# Patient Record
Sex: Male | Born: 1985 | Race: White | Hispanic: No | Marital: Single | Smoking: Current every day smoker
Health system: Southern US, Community
[De-identification: ages and names within clinical notes are randomized; demographics above are authoritative.]

## PROBLEM LIST (undated history)

## (undated) DIAGNOSIS — J40 Bronchitis, not specified as acute or chronic: Secondary | ICD-10-CM

## (undated) DIAGNOSIS — J45909 Unspecified asthma, uncomplicated: Secondary | ICD-10-CM

## (undated) HISTORY — PX: WRIST ARTHROSCOPY: SUR100

## (undated) HISTORY — PX: ANKLE ARTHROSCOPY: SHX545

---

## 2014-11-04 ENCOUNTER — Emergency Department (HOSPITAL_COMMUNITY): Payer: Self-pay

## 2014-11-04 ENCOUNTER — Encounter (HOSPITAL_COMMUNITY): Payer: Self-pay | Admitting: Emergency Medicine

## 2014-11-04 ENCOUNTER — Emergency Department (HOSPITAL_COMMUNITY)
Admission: EM | Admit: 2014-11-04 | Discharge: 2014-11-04 | Disposition: A | Discharge status: Home / Self Care | Payer: Self-pay | Attending: Emergency Medicine | Admitting: Emergency Medicine

## 2014-11-04 DIAGNOSIS — R110 Nausea: Secondary | ICD-10-CM | POA: Insufficient documentation

## 2014-11-04 DIAGNOSIS — S4992XA Unspecified injury of left shoulder and upper arm, initial encounter: Secondary | ICD-10-CM

## 2014-11-04 DIAGNOSIS — W132XXA Fall from, out of or through roof, initial encounter: Secondary | ICD-10-CM | POA: Insufficient documentation

## 2014-11-04 DIAGNOSIS — Y9289 Other specified places as the place of occurrence of the external cause: Secondary | ICD-10-CM | POA: Insufficient documentation

## 2014-11-04 DIAGNOSIS — Y998 Other external cause status: Secondary | ICD-10-CM | POA: Insufficient documentation

## 2014-11-04 DIAGNOSIS — Z720 Tobacco use: Secondary | ICD-10-CM | POA: Insufficient documentation

## 2014-11-04 DIAGNOSIS — Y9389 Activity, other specified: Secondary | ICD-10-CM | POA: Insufficient documentation

## 2014-11-04 MED ORDER — HYDROMORPHONE HCL 1 MG/ML IJ SOLN
1.0000 mg | Freq: Once | INTRAMUSCULAR | Status: AC
  Administered 2014-11-04: 1 mg via INTRAVENOUS
  Filled 2014-11-04: qty 1

## 2014-11-04 MED ORDER — SODIUM CHLORIDE 0.9 % IV BOLUS (SEPSIS)
1000.0000 mL | Freq: Once | INTRAVENOUS | Status: AC
  Administered 2014-11-04: 1000 mL via INTRAVENOUS

## 2014-11-04 MED ORDER — HYDROCODONE-ACETAMINOPHEN 5-325 MG PO TABS: 1.0000 | ORAL_TABLET | Freq: Four times a day (QID) | ORAL | Status: DC | PRN

## 2014-11-04 MED ORDER — IBUPROFEN 800 MG PO TABS: 800.0000 mg | ORAL_TABLET | Freq: Three times a day (TID) | ORAL | Status: AC

## 2014-11-04 NOTE — ED Provider Notes (Signed)
CSN: [id number]     Arrival date & time 11/04/14  1833 History   First MD Initiated Contact with Patient 11/04/14 1853     Chief Complaint  Patient presents with  . Shoulder Pain     (Consider location/radiation/quality/duration/timing/severity/associated sxs/prior Treatment) HPI Patient presents immediately after sustaining an injury to his left shoulder. Patient fell approximately 10/12 feet from a low roof onto his left shoulder. No head trauma, no loss of consciousness, no neck pain or head pain. No chest pain, belly pain. Patient has been ambulatory. Patient has substantial, severe pain in the left shoulder, worse with any range of motion attempt. No distal loss of sensation or weakness. No history of ankle surgery, pain. Patient has not taken any medication for pain relief thus far.  History reviewed. No pertinent past medical history. Past Surgical History  Procedure Laterality Date  . Wrist arthroscopy    . Ankle arthroscopy     History reviewed. No pertinent family history. History  Substance Use Topics  . Smoking status: Current Every Day Smoker -- 0.50 packs/day    Types: Cigarettes  . Smokeless tobacco: Not on file  . Alcohol Use: No    Review of Systems  Constitutional: Negative for fever.  HENT: Negative.   Respiratory: Negative for shortness of breath.   Cardiovascular: Negative for chest pain.  Gastrointestinal: Positive for nausea. Negative for vomiting.  Musculoskeletal:       Negative aside from HPI  Skin: Negative for wound.       Negative aside from HPI  Allergic/Immunologic: Negative for food allergies and immunocompromised state.  Neurological: Negative for weakness.      Allergies  Review of patient's allergies indicates no known allergies.  Home Medications   Prior to Admission medications   Not on File   BP 128/71 mmHg  Pulse 87  Temp(Src) 97.9 F (36.6 C) (Oral)  Resp 24  Ht  (1.753 m)  Wt 165 lb (74.844 kg)  BMI 24.36  kg/m2  SpO2 98% Physical Exam  Constitutional: He is oriented to person, place, and time. He appears well-developed. No distress.  Very uncomfortable appearing young male, holding his left arm in complete abduction  HENT:  Head: Normocephalic and atraumatic.  Eyes: Conjunctivae and EOM are normal.  Neck: Full passive range of motion without pain. No spinous process tenderness and no muscular tenderness present. No rigidity. No tracheal deviation, no edema, no erythema and normal range of motion present.  Cardiovascular: Normal rate and regular rhythm.   Pulmonary/Chest: Effort normal. No stridor. No respiratory distress.  Abdominal: He exhibits no distension.  Musculoskeletal: He exhibits no edema.       Right shoulder: Normal.       Left wrist: Normal.       Arms: Neurological: He is alert and oriented to person, place, and time.  Skin: Skin is warm and dry.  Psychiatric: He has a normal mood and affect.  Nursing note and vitals reviewed.  Patient ambulatory to the room ED Course  Procedures (including critical care time)   Imaging Review Dg Shoulder Left  11/04/2014   CLINICAL DATA:  29 year old male with left shoulder pain after he slipped and fell off a roof.  EXAM: LEFT SHOULDER - 2+ VIEW  COMPARISON:  None.  FINDINGS: Superior subluxation of the distal clavicle with respect to the acromion. However, there is healed posttraumatic deformity of the distal clavicular head. This injury is age indeterminate. No evidence of fracture of the humerus or  scapula. The humeral head is located. The visualized thorax is unremarkable.  IMPRESSION: Age indeterminate grade 3 acromioclavicular joint separation. There is some healed irregularity about the distal aspect of the clavicle raising the possibility that this may represent a remote, or possibly acute on remote injury.   Electronically Signed   By: Malachy MoanHeath  McCullough M.D.   On: 11/04/2014 19:43   On repeat exam the patient appears calm. I  demonstrated the x-ray to him, I agree with the interpretation.  Ice applied, sling applied, both well tolerated. MDM  Patient presents with acute onset left shoulder pain following a fall. Patient's x-ray demonstrates disruption of the acromioclavicular joint, no fracture. Patient had pain well-controlled with narcotics, ice. Patient was placed in sling, discharged in stable condition.  Gerhard Munchobert Rilley Poulter, MD 11/04/14 2033

## 2014-11-04 NOTE — Discharge Instructions (Signed)
As discussed, it is very important that you speak with our orthopedists tomorrow to arrange appropriate ongoing evaluation of your acromioclavicular separation.  Please use ice packs, 4 times daily, and addition to the prescribed medication for pain relief.  Return to the emergency department for concerning changes in your condition.  Acromioclavicular Injuries The AC (acromioclavicular) joint is the joint in the shoulder where the collarbone (clavicle) meets the shoulder blade (scapula). The part of the shoulder blade connected to the collarbone is called the acromion. Common problems with and treatments for the [facility] joint are detailed below. ARTHRITIS Arthritis occurs when the joint has been injured and the smooth padding between the joints (cartilage) is lost. This is the wear and tear seen in most joints of the body if they have been overused. This causes the joint to produce pain and swelling which is worse with activity.  AC JOINT SEPARATION AC joint separation means that the ligaments connecting the acromion of the shoulder blade and collarbone have been damaged, and the two bones no longer line up. AC separations can be anywhere from mild to severe, and are "graded" depending upon which ligaments are torn and how badly they are torn.  Grade I Injury: the least damage is done, and the [facility] joint still lines up.  Grade II Injury: damage to the ligaments which reinforce the [facility] joint. In a Grade II injury, these ligaments are stretched but not entirely torn. When stressed, the [facility] joint becomes painful and unstable.  Grade III Injury: AC and secondary ligaments are completely torn, and the collarbone is no longer attached to the shoulder blade. This results in deformity; a prominence of the end of the clavicle. AC JOINT FRACTURE AC joint fracture means that there has been a break in the bones of the [facility] joint, usually the end of the clavicle. TREATMENT TREATMENT OF AC ARTHRITIS  There is  currently no way to replace the cartilage damaged by arthritis. The best way to improve the condition is to decrease the activities which aggravate the problem. Application of ice to the joint helps decrease pain and soreness (inflammation). The use of non-steroidal anti-inflammatory medication is helpful.  If less conservative measures do not work, then cortisone shots (injections) may be used. These are anti-inflammatories; they decrease the soreness in the joint and swelling.  If non-surgical measures fail, surgery may be recommended. The procedure is generally removal of a portion of the end of the clavicle. This is the part of the collarbone closest to your acromion which is stabilized with ligaments to the acromion of the shoulder blade. This surgery may be performed using a tube-like instrument with a light (arthroscope) for looking into a joint. It may also be performed as an open surgery through a small incision by the surgeon. Most patients will have good range of motion within 6 weeks and may return to all activity including sports by 8-12 weeks, barring complications. TREATMENT OF AN AC SEPARATION  The initial treatment is to decrease pain. This is best accomplished by immobilizing the arm in a sling and placing an ice pack to the shoulder for 20 to 30 minutes every 2 hours as needed. As the pain starts to subside, it is important to begin moving the fingers, wrist, elbow and eventually the shoulder in order to prevent a stiff or "frozen" shoulder. Instruction on when and how much to move the shoulder will be provided by your caregiver. The length of time needed to regain full motion and function  depends on the amount or grade of the injury. Recovery from a Grade I AC separation usually takes 10 to 14 days, whereas a Grade III may take 6 to 8 weeks.  Grade I and II separations usually do not require surgery. Even Grade III injuries usually allow return to full activity with few restrictions.  Treatment is also based on the activity demands of the injured shoulder. For example, a high level quarterback with an injured throwing arm will receive more aggressive treatment than someone with a desk job who rarely uses his/her arm for strenuous activities. In some cases, a painful lump may persist which could require a later surgery. Surgery can be very successful, but the benefits must be weighed against the potential risks. TREATMENT OF AN AC JOINT FRACTURE Fracture treatment depends on the type of fracture. Sometimes a splint or sling may be all that is required. Other times surgery may be required for repair. This is more frequently the case when the ligaments supporting the clavicle are completely torn. Your caregiver will help you with these decisions and together you can decide what will be the best treatment. HOME CARE INSTRUCTIONS   Apply ice to the injury for 15-20 minutes each hour while awake for 2 days. Put the ice in a plastic bag and place a towel between the bag of ice and skin.  If a sling has been applied, wear it constantly for as long as directed by your caregiver, even at night. The sling or splint can be removed for bathing or showering or as directed. Be sure to keep the shoulder in the same place as when the sling is on. Do not lift the arm.  If a figure-of-eight splint has been applied it should be tightened gently by another person every day. Tighten it enough to keep the shoulders held back. Allow enough room to place the index finger between the body and strap. Loosen the splint immediately if there is numbness or tingling in the hands.  Take over-the-counter or prescription medicines for pain, discomfort or fever as directed by your caregiver.  If you or your child has received a follow up appointment, it is very important to keep that appointment in order to avoid long term complications, chronic pain or disability. SEEK MEDICAL CARE IF:   The pain is not relieved  with medications.  There is increased swelling or discoloration that continues to get worse rather than better.  You or your child has been unable to follow up as instructed.  There is progressive numbness and tingling in the arm, forearm or hand. SEEK IMMEDIATE MEDICAL CARE IF:   The arm is numb, cold or pale.  There is increasing pain in the hand, forearm or fingers. MAKE SURE YOU:   Understand these instructions.  Will watch your condition.  Will get help right away if you are not doing well or get worse. Document Released: 01/18/2005 Document Revised: 07/03/2011 Document Reviewed: 07/13/2008 [facility] Patient Information 2015 [place], [place]. This information is not intended to replace advice given to you by your health care provider. Make sure you discuss any questions you have with your health care provider.

## 2014-11-04 NOTE — ED Notes (Signed)
Dr.Lockwood at bedside  

## 2014-11-04 NOTE — ED Notes (Signed)
PT stated he slipped and fell off a roof x2 hours onto his left shoulder. Obvious deformity noted to left shoulder with decreased and painful ROM.

## 2014-11-13 ENCOUNTER — Encounter (HOSPITAL_COMMUNITY): Payer: Self-pay | Admitting: Emergency Medicine

## 2014-11-13 ENCOUNTER — Emergency Department (HOSPITAL_COMMUNITY)
Admission: EM | Admit: 2014-11-13 | Discharge: 2014-11-13 | Disposition: A | Discharge status: Home / Self Care | Payer: Self-pay | Attending: Emergency Medicine | Admitting: Emergency Medicine

## 2014-11-13 ENCOUNTER — Emergency Department (HOSPITAL_COMMUNITY): Payer: Self-pay

## 2014-11-13 DIAGNOSIS — S43102D Unspecified dislocation of left acromioclavicular joint, subsequent encounter: Secondary | ICD-10-CM

## 2014-11-13 DIAGNOSIS — Z720 Tobacco use: Secondary | ICD-10-CM | POA: Insufficient documentation

## 2014-11-13 DIAGNOSIS — W1789XD Other fall from one level to another, subsequent encounter: Secondary | ICD-10-CM | POA: Insufficient documentation

## 2014-11-13 MED ORDER — HYDROCODONE-ACETAMINOPHEN 5-325 MG PO TABS: 1.0000 | ORAL_TABLET | Freq: Four times a day (QID) | ORAL | Status: DC | PRN

## 2014-11-13 MED ORDER — IBUPROFEN 800 MG PO TABS: 800.0000 mg | ORAL_TABLET | Freq: Three times a day (TID) | ORAL | Status: DC | PRN

## 2014-11-13 MED ORDER — OXYCODONE-ACETAMINOPHEN 5-325 MG PO TABS
1.0000 | ORAL_TABLET | Freq: Once | ORAL | Status: AC
  Administered 2014-11-13: 1 via ORAL
  Filled 2014-11-13: qty 1

## 2014-11-13 NOTE — ED Notes (Signed)
PA Tran at bedside. 

## 2014-11-13 NOTE — ED Notes (Signed)
Ortho called 

## 2014-11-13 NOTE — Discharge Instructions (Signed)
Acromioclavicular Separation with Rehab The acromioclavicular joint is the joint between the roof of the shoulder (acromion) and the collarbone (clavicle). It is vulnerable to injury. An acromioclavicular (AC) separation is a partial or complete tear (sprain), injury, or redness and soreness (inflammation) of the ligaments that cross the acromioclavicular joint and hold it in place. There are two ligaments in this area that are vulnerable to injury, the acromioclavicular ligament and the coracoclavicular ligament. SYMPTOMS   Tenderness and swelling, or a bump on top of the shoulder (at the AC joint).  Bruising (contusion) in the area within 48 hours of injury.  Loss of strength or pain when reaching over the head or across the body. CAUSES  AC separation is caused by direct trauma to the joint (falling on your shoulder) or indirect trauma (falling on an outstretched arm). RISK INCREASES WITH:  Sports that require contact or collision, throwing sports (i.e. racquetball, squash).  Poor strength and flexibility.  Previous shoulder sprain or dislocation.  Poorly fitted or padded protective equipment. PREVENTION   Warm-up and stretch properly before activity.  Maintain physical fitness:  Shoulder strength.  Shoulder flexibility.  Cardiovascular fitness.  Wear properly fitted and padded protective equipment.  Learn and use proper technique when playing sports. Have a coach correct improper technique, including falling and landing.  Apply taping, protective strapping or padding, or an adhesive bandage as recommended before practice or competition. PROGNOSIS   If treated properly, the symptoms of AC separation can be expected to go away.  If treated improperly, permanent disability may occur unless surgery is performed.  Healing time varies with type of sport and position, arm injured (dominant versus non-dominant) and severity of sprain. RELATED COMPLICATIONS  Weakness and  fatigue of the arm or shoulder are possible but uncommon.  Pain and inflammation of the AC joint may continue.  Prolonged healing time may be necessary if usual activities are resumed too early. This causes a susceptibility to recurrent injury.  Prolonged disability may occur.  The shoulder may remain unstable or arthritic following repeated injury. TREATMENT  Treatment initially involves ice and medication to help reduce pain and inflammation. It may also be necessary to modify your activities in order to prevent further injury. Both non-surgical and surgical interventions exist to treat AC separation. Non-surgical intervention is usually recommended and involves wearing a sling to immobilize the joint for a period of time to allow for healing. Surgical intervention is usually only considered for severe sprains of the ligament or for individuals who do not improve after 2 to 6 months of non-surgical treatment. Surgical interventions require 4 to 6 months before a return to sports is possible. MEDICATION  If pain medication is necessary, nonsteroidal anti-inflammatory medications, such as aspirin and ibuprofen, or other minor pain relievers, such as acetaminophen, are often recommended.  Do not take pain medication for 7 days before surgery.  Prescription pain relievers may be given by your caregiver. Use only as directed and only as much as you need.  Ointments applied to the skin may be helpful.  Corticosteroid injections may be given to reduce inflammation. HEAT AND COLD  Cold treatment (icing) relieves pain and reduces inflammation. Cold treatment should be applied for 10 to 15 minutes every 2 to 3 hours for inflammation and pain and immediately after any activity that aggravates your symptoms. Use ice packs or an ice massage.  Heat treatment may be used prior to performing the stretching and strengthening activities prescribed by your caregiver, physical therapist or   athletic  trainer. Use a heat pack or a warm soak. SEEK IMMEDIATE MEDICAL CARE IF:   Pain, swelling or bruising worsens despite treatment.  There is pain, numbness or coldness in the arm.  Discoloration appears in the fingernails.  New, unexplained symptoms develop. EXERCISES  RANGE OF MOTION (ROM) AND STRETCHING EXERCISES - Acromioclavicular Separation These exercises may help you when beginning to rehabilitate your injury. Your symptoms may resolve with or without further involvement from your physician, physical therapist or athletic trainer. While completing these exercises, remember:  Restoring tissue flexibility helps normal motion to return to the joints. This allows healthier, less painful movement and activity.  An effective stretch should be held for at least 30 seconds.  A stretch should never be painful. You should only feel a gentle lengthening or release in the stretched tissue. ROM - Pendulum  Bend at the waist so that your right / left arm falls away from your body. Support yourself with your opposite hand on a solid surface, such as a table or a countertop.  Your right / left arm should be perpendicular to the ground. If it is not perpendicular, you need to lean over farther. Relax the muscles in your right / left arm and shoulder as much as possible.  Gently sway your hips and trunk so they move your right / left arm without any use of your right / left shoulder muscles.  Progress your movements so that your right / left arm moves side to side, then forward and backward, and finally, both clockwise and counterclockwise.  Complete __________ repetitions in each direction. Many people use this exercise to relieve discomfort in their shoulder as well as to gain range of motion. Repeat __________ times. Complete this exercise __________ times per day. STRETCH - Flexion, Seated   Sit in a firm chair so that your right / left forearm can rest on a table or countertop. Your right /  left elbow should rest below the height of your shoulder so that your shoulder feels supported and not tense or uncomfortable.  Keeping your right / left shoulder relaxed, lean forward at your waist, allowing your right / left hand to slide forward. Bend forward until you feel a moderate stretch in your shoulder, but before you feel an increase in your pain.  Hold __________ seconds. Slowly return to your starting position. Repeat __________ times. Complete this exercise __________ times per day. STRETCH - Flexion, Standing  Stand with good posture. With an underhand grip on your right / left and an overhand grip on the opposite hand, grasp a broomstick or cane so that your hands are a little more than shoulder-width apart.  Keeping your right / left elbow straight and shoulder muscles relaxed, push the stick with your opposite hand to raise your right / left arm in front of your body and then overhead. Raise your arm until you feel a stretch in your right / left shoulder, but before you have increased shoulder pain.  Try to avoid shrugging your right / left shoulder as your arm rises by keeping your shoulder blade tucked down and toward your mid-back spine. Hold __________ seconds.  Slowly return to the starting position. Repeat __________ times. Complete this exercise __________ times per day. STRENGTHENING EXERCISES - Acromioclavicular Separation These exercises may help you when beginning to rehabilitate your injury. They may resolve your symptoms with or without further involvement from your physician, physical therapist or athletic trainer. While completing these exercises, remember:  Muscles   can gain both the endurance and the strength needed for everyday activities through controlled exercises.  Complete these exercises as instructed by your physician, physical therapist or athletic trainer. Progress the resistance and repetitions only as guided.  You may experience muscle soreness or  fatigue, but the pain or discomfort you are trying to eliminate should never worsen during these exercises. If this pain does worsen, stop and make certain you are following the directions exactly. If the pain is still present after adjustments, discontinue the exercise until you can discuss the trouble with your clinician. STRENGTH - Shoulder Abductors, Isometric   With good posture, stand or sit about 4-6 inches from a wall with your right / left side facing the wall.  Bend your right / left elbow. Gently press your right / left elbow into the wall. Increase the pressure gradually until you are pressing as hard as you can without shrugging your shoulder or increasing any shoulder discomfort.  Hold __________ seconds.  Release the tension slowly. Relax your shoulder muscles completely before you start the next repetition. Repeat __________ times. Complete this exercise __________ times per day. STRENGTH - Internal Rotators, Isometric  Keep your right / left elbow at your side and bend it 90 degrees.  Step into a door frame so that the inside of your right / left wrist can press against the door frame without your upper arm leaving your side.  Gently press your right / left wrist into the door frame as if you were trying to draw the palm of your hand to your abdomen. Gradually increase the tension until you are pressing as hard as you can without shrugging your shoulder or increasing any shoulder discomfort.  Hold __________ seconds.  Release the tension slowly. Relax your shoulder muscles completely before you the next repetition. Repeat __________ times. Complete this exercise __________ times per day.  STRENGTH - External Rotators, Isometric  Keep your right / left elbow at your side and bend it 90 degrees.  Step into a door frame so that the outside of your right / left wrist can press against the door frame without your upper arm leaving your side.  Gently press your right / left  wrist into the door frame as if you were trying to swing the back of your hand away from your abdomen. Gradually increase the tension until you are pressing as hard as you can without shrugging your shoulder or increasing any shoulder discomfort.  Hold __________ seconds.  Release the tension slowly. Relax your shoulder muscles completely before you the next repetition. Repeat __________ times. Complete this exercise __________ times per day. STRENGTH - Internal Rotators  Secure a rubber exercise band/tubing to a fixed object so that it is at the same height as your right / left elbow when you are standing or sitting on a firm surface.  Stand or sit so that the secured exercise band/tubing is at your right / left side.  Bend your elbow 90 degrees. Place a folded towel or small pillow under your right / left arm so that your elbow is a few inches away from your side.  Keeping the tension on the exercise band/tubing, pull it across your body toward your abdomen. Be sure to keep your body steady so that the movement is only coming from your shoulder rotating.  Hold __________ seconds. Release the tension in a controlled manner as you return to the starting position. Repeat __________ times. Complete this exercise __________ times per day. STRENGTH -   External Rotators  Secure a rubber exercise band/tubing to a fixed object so that it is at the same height as your right / left elbow when you are standing or sitting on a firm surface.  Stand or sit so that the secured exercise band/tubing is at your side that is not injured.  Bend your elbow 90 degrees. Place a folded towel or small pillow under your right / left arm so that your elbow is a few inches away from your side.  Keeping the tension on the exercise band/tubing, pull it away from your body, as if pivoting on your elbow. Be sure to keep your body steady so that the movement is only coming from your shoulder rotating.  Hold __________  seconds. Release the tension in a controlled manner as you return to the starting position. Repeat __________ times. Complete this exercise __________ times per day. Document Released: 04/10/2005 Document Revised: 07/03/2011 Document Reviewed: 07/23/2008 [employer] Patient Information 2015 [employer]. This information is not intended to replace advice given to you by your health care provider. Make sure you discuss any questions you have with your health care provider.  

## 2014-11-13 NOTE — ED Provider Notes (Signed)
CSN: [id number]     Arrival date & time 11/13/14  1444 History   First MD Initiated Contact with Patient 11/13/14 1504     Chief Complaint  Patient presents with  . Shoulder Injury     (Consider location/radiation/quality/duration/timing/severity/associated sxs/prior Treatment) HPI   29 year old male presents for evaluation of L shoulder injury.  Pt report he injured his L shoulder on 7/13 when he fell approximately 10 ft from a low roof and landed on his L shoulder.  He was evaluated in the ER, xray demonstrates disruption of the [facility] joint without fx.  Pt was discharged with a sling and RICE along with narcotic pain meds. Today pt reportedly tripped over some boards on his porch, and fell landed on his L shoulder. He denies hitting his head or loss of consciousness. Denies any neck or back injury. Complaining of exquisite tenderness to left shoulder, nonradiating, worsening with movement. Pain is 10 out of 10. Denies any numbness or weakness. No specific treatment tried. Patient is left-hand dominant. Denies any precipitating symptoms prior to the fall.      History reviewed. No pertinent past medical history. Past Surgical History  Procedure Laterality Date  . Wrist arthroscopy    . Ankle arthroscopy     History reviewed. No pertinent family history. History  Substance Use Topics  . Smoking status: Current Every Day Smoker -- 0.50 packs/day    Types: Cigarettes  . Smokeless tobacco: Not on file  . Alcohol Use: No    Review of Systems  Constitutional: Negative for fever.  Musculoskeletal: Positive for arthralgias. Negative for back pain and neck pain.  Skin: Negative for wound.  Neurological: Negative for numbness and headaches.      Allergies  Review of patient's allergies indicates no known allergies.  Home Medications   Prior to Admission medications   Medication Sig Start Date End Date Taking? Authorizing Provider  ibuprofen (ADVIL,MOTRIN) 200 MG tablet Take 400 mg  by mouth every 6 (six) hours as needed for headache, mild pain or moderate pain.   Yes Historical Provider, MD  HYDROcodone-acetaminophen (NORCO/VICODIN) 5-325 MG per tablet Take 1 tablet by mouth every 6 (six) hours as needed for severe pain. Patient not taking: Reported on 11/13/2014 11/04/14   Gerhard Munch, MD   BP 148/70 mmHg  Pulse 77  Temp(Src) 97.8 F (36.6 C) (Oral)  Resp 16  SpO2 98% Physical Exam  Constitutional: He is oriented to person, place, and time. He appears well-developed and well-nourished. No distress.  HENT:  Head: Atraumatic.  Eyes: Conjunctivae are normal.  Neck: Neck supple.  Cardiovascular: Intact distal pulses.   Musculoskeletal: He exhibits tenderness (L shoulder: point tenderness to [facility] joint and deltoid on palpation with obvious closed deformity, no crepitus. Arm held in  flexed and addition position.  L elbow and L wrist nontender.).  No midline spine tenderness.   L radial pulse intact.  Sensation intact throughout L arm.   Neurological: He is alert and oriented to person, place, and time.  Skin: No rash noted.  Psychiatric: He has a normal mood and affect.  Nursing note and vitals reviewed.   ED Course  Procedures (including critical care time)  3:17 PM Patient reinjured his left shoulder from a mechanical fall. X-ray demonstrate a stable appearing grade 3 AC joint separation without acute fracture. There is no evidence of skin tenting. Patient is neurovascularly intact. Patient will be provided with pain medication sling, and outpatient follow-up with orthopedic for further  care.  Labs Review Labs Reviewed - No data to display  Imaging Review Dg Shoulder Left  11/13/2014   CLINICAL DATA:  Left shoulder injury 2 weeks ago; today patient has sustained a fall striking the left shoulder again. , limited range of motion.  EXAM: LEFT SHOULDER - 2+ VIEW  COMPARISON:  Left shoulder series of November 04, 2014  FINDINGS: Again demonstrated is [facility] joint  separation. This has not appreciably changed since the previous study. No acute fracture of the distal clavicle nor of the acromion is demonstrated. The glenohumeral joint is normal. The humeral head is intact. The coracoid is unremarkable. The observed portions of the upper left ribs are normal.  IMPRESSION: Stable appearing grade 3 acromioclavicular joint separation. No acute fracture or is observed.   Electronically Signed   By: David  [place] M.D.   On: 11/13/2014 15:44     EKG Interpretation None      MDM   Final diagnoses:  Acromioclavicular joint separation, left, subsequent encounter    BP 123/97 mmHg  Pulse 77  Temp(Src) 98.1 F (36.7 C) (Oral)  Resp 16  SpO2 100%  I have reviewed nursing notes and vital signs. I personally viewed the imaging tests through PACS system and agrees with radiologist's intepretation I reviewed available ER/hospitalization records through the EMR     Fayrene Helper, PA-C 11/13/14 1651  Jerelyn Scott, MD 11/13/14 [phone]

## 2014-11-13 NOTE — ED Notes (Signed)
Pt states he injured his left shoulder a couple weeks ago. States today he tripped over some boards on his porch and landed on his left shoulder today. Visible knot on top of left shoulder in triage, states he can barely left his left arm. Pulse, sensation, movement intact in left hand.

## 2017-12-29 ENCOUNTER — Other Ambulatory Visit: Payer: Self-pay

## 2017-12-29 ENCOUNTER — Encounter (HOSPITAL_COMMUNITY): Payer: Self-pay | Admitting: Emergency Medicine

## 2017-12-29 ENCOUNTER — Emergency Department (HOSPITAL_COMMUNITY): Payer: Medicaid - Out of State

## 2017-12-29 ENCOUNTER — Emergency Department (HOSPITAL_COMMUNITY)
Admission: EM | Admit: 2017-12-29 | Discharge: 2017-12-29 | Disposition: A | Discharge status: Home / Self Care | Payer: Medicaid - Out of State | Attending: Emergency Medicine | Admitting: Emergency Medicine

## 2017-12-29 DIAGNOSIS — J45909 Unspecified asthma, uncomplicated: Secondary | ICD-10-CM | POA: Insufficient documentation

## 2017-12-29 DIAGNOSIS — Y92149 Unspecified place in prison as the place of occurrence of the external cause: Secondary | ICD-10-CM | POA: Diagnosis not present

## 2017-12-29 DIAGNOSIS — Y999 Unspecified external cause status: Secondary | ICD-10-CM | POA: Insufficient documentation

## 2017-12-29 DIAGNOSIS — Y939 Activity, unspecified: Secondary | ICD-10-CM | POA: Insufficient documentation

## 2017-12-29 DIAGNOSIS — W19XXXA Unspecified fall, initial encounter: Secondary | ICD-10-CM | POA: Diagnosis not present

## 2017-12-29 DIAGNOSIS — S4992XA Unspecified injury of left shoulder and upper arm, initial encounter: Secondary | ICD-10-CM | POA: Diagnosis present

## 2017-12-29 DIAGNOSIS — F17210 Nicotine dependence, cigarettes, uncomplicated: Secondary | ICD-10-CM | POA: Diagnosis not present

## 2017-12-29 DIAGNOSIS — S43102A Unspecified dislocation of left acromioclavicular joint, initial encounter: Secondary | ICD-10-CM | POA: Diagnosis not present

## 2017-12-29 HISTORY — DX: Bronchitis, not specified as acute or chronic: J40

## 2017-12-29 HISTORY — DX: Unspecified asthma, uncomplicated: J45.909

## 2017-12-29 MED ORDER — ACETAMINOPHEN 500 MG PO TABS: 500.0000 mg | ORAL_TABLET | Freq: Four times a day (QID) | ORAL | 0 refills | Status: AC | PRN

## 2017-12-29 MED ORDER — ACETAMINOPHEN 500 MG PO TABS
1000.0000 mg | ORAL_TABLET | Freq: Once | ORAL | Status: AC
  Administered 2017-12-29: 1000 mg via ORAL
  Filled 2017-12-29: qty 2

## 2017-12-29 NOTE — Discharge Instructions (Addendum)
-  Follow up with orthopedic surgery

## 2017-12-29 NOTE — ED Triage Notes (Signed)
Patient c/o left shoulder pain. Per patient tripped over pants and fell into bunk hitting shoulder. Patient reports hitting head, denies any LOC or blurred vision. Obvious deformity noted.

## 2017-12-29 NOTE — ED Provider Notes (Signed)
[facility] EMERGENCY DEPARTMENT Provider Note   CSN: [id number] Arrival date & time: 12/29/17  1559     History   Chief Complaint Chief Complaint  Patient presents with  . Shoulder Pain    HPI Derick Ranke is a 32 y.o. male.  HPI Patient is a Presenter, broadcasting.  Larey Seat today hitting the left shoulder.  Did not hit his head.  Previous left shoulder AC separation.  States this feels similar.  No other injury.  No neck pain.  Is not on anticoagulation. Past Medical History:  Diagnosis Date  . Asthma   . Bronchitis     There are no active problems to display for this patient.   Past Surgical History:  Procedure Laterality Date  . ANKLE ARTHROSCOPY    . WRIST ARTHROSCOPY          Home Medications    Prior to Admission medications   Medication Sig Start Date End Date Taking? Authorizing Provider  acetaminophen (TYLENOL) 500 MG tablet Take 1 tablet (500 mg total) by mouth every 6 (six) hours as needed. 12/29/17   Benjiman Core, MD    Family History History reviewed. No pertinent family history.  Social History Social History   Tobacco Use  . Smoking status: Current Every Day Smoker    Packs/day: 0.50    Types: Cigarettes  . Smokeless tobacco: Never Used  Substance Use Topics  . Alcohol use: No  . Drug use: No     Allergies   Ibuprofen and Nsaids   Review of Systems Review of Systems  Constitutional: Negative for appetite change.  Respiratory: Negative for shortness of breath.   Cardiovascular: Negative for chest pain.  Gastrointestinal: Negative for abdominal pain.  Genitourinary: Negative for flank pain.  Musculoskeletal:       Left shoulder pain.  Neurological: Negative for headaches.  Hematological: Negative for adenopathy.  Psychiatric/Behavioral: Negative for confusion.     Physical Exam Updated Vital Signs BP 132/75 (BP Location: Right Arm)   Pulse 84   Temp 98.8 F (37.1 C) (Oral)   Resp 18   Ht 5\' 8"  (1.727 m)   Wt 74.8 kg   SpO2 99%    BMI 25.09 kg/m   Physical Exam  Constitutional: He appears well-developed.  HENT:  Head: Normocephalic.  Eyes: EOM are normal.  Neck: Neck supple.  Cardiovascular: Normal rate.  Pulmonary/Chest: Effort normal.  Musculoskeletal: He exhibits tenderness.  Tenderness with deformity over left AC joint.  No tenderness over elbow or hand.  Neurovascular intact in left hand.  Patient is in handcuffs.  Neurological: He is alert.     ED Treatments / Results  Labs (all labs ordered are listed, but only abnormal results are displayed) Labs Reviewed - No data to display  EKG None  Radiology Dg Shoulder Left  Result Date: 12/29/2017 CLINICAL DATA:  Status post fall with left shoulder pain. EXAM: LEFT SHOULDER - 2+ VIEW COMPARISON:  11/25/2017 FINDINGS: There is no evidence of acute fracture or dislocation. Chronic AC joint separation. Soft tissues are normal. IMPRESSION: No acute fracture or dislocation identified about the left shoulder. Chronic AC joint separation. Electronically Signed   By: Ted Mcalpine M.D.   On: 12/29/2017 17:34    Procedures Procedures (including critical care time)  Medications Ordered in ED Medications  acetaminophen (TYLENOL) tablet 1,000 mg (1,000 mg Oral Given 12/29/17 1649)     Initial Impression / Assessment and Plan / ED Course  I have reviewed the triage vital signs and  the nursing notes.  Pertinent labs & imaging results that were available during my care of the patient were reviewed by me and considered in my medical decision making (see chart for details).     Patient with left AC separation.  Likely acute on chronic.  No narcotics per jail.  Tylenol for pain.  Discharge home with outpatient follow-up with orthopedic surgery.  Final Clinical Impressions(s) / ED Diagnoses   Final diagnoses:  AC separation, left, initial encounter    ED Discharge Orders         Ordered    acetaminophen (TYLENOL) 500 MG tablet  Every 6 hours PRN      12/29/17 1759           Benjiman Core, MD 12/29/17 1802

## 2017-12-29 NOTE — ED Notes (Signed)
Pt report of hurting L shoulder really badly when falling into a bunk  Pt is incarcerated  Dr Rubin Payor has seen  Awaiting rad

## 2017-12-29 NOTE — ED Notes (Signed)
Rad tech reported that pt took something from mouth and put in pocket in Rad  Pt pocket releaved a piece of plastic which was removed and put into sharps container

## 2017-12-29 NOTE — ED Notes (Signed)
Awaiting rad read 

## 2019-07-11 IMAGING — DX DG SHOULDER 2+V*L*
3 series · 3 of 3 positions shown · non-contrast
Comparison: 11/25/2017

CLINICAL DATA: Status post fall with left shoulder pain.

EXAM:
LEFT SHOULDER - 2+ VIEW

[shoulder grashey]
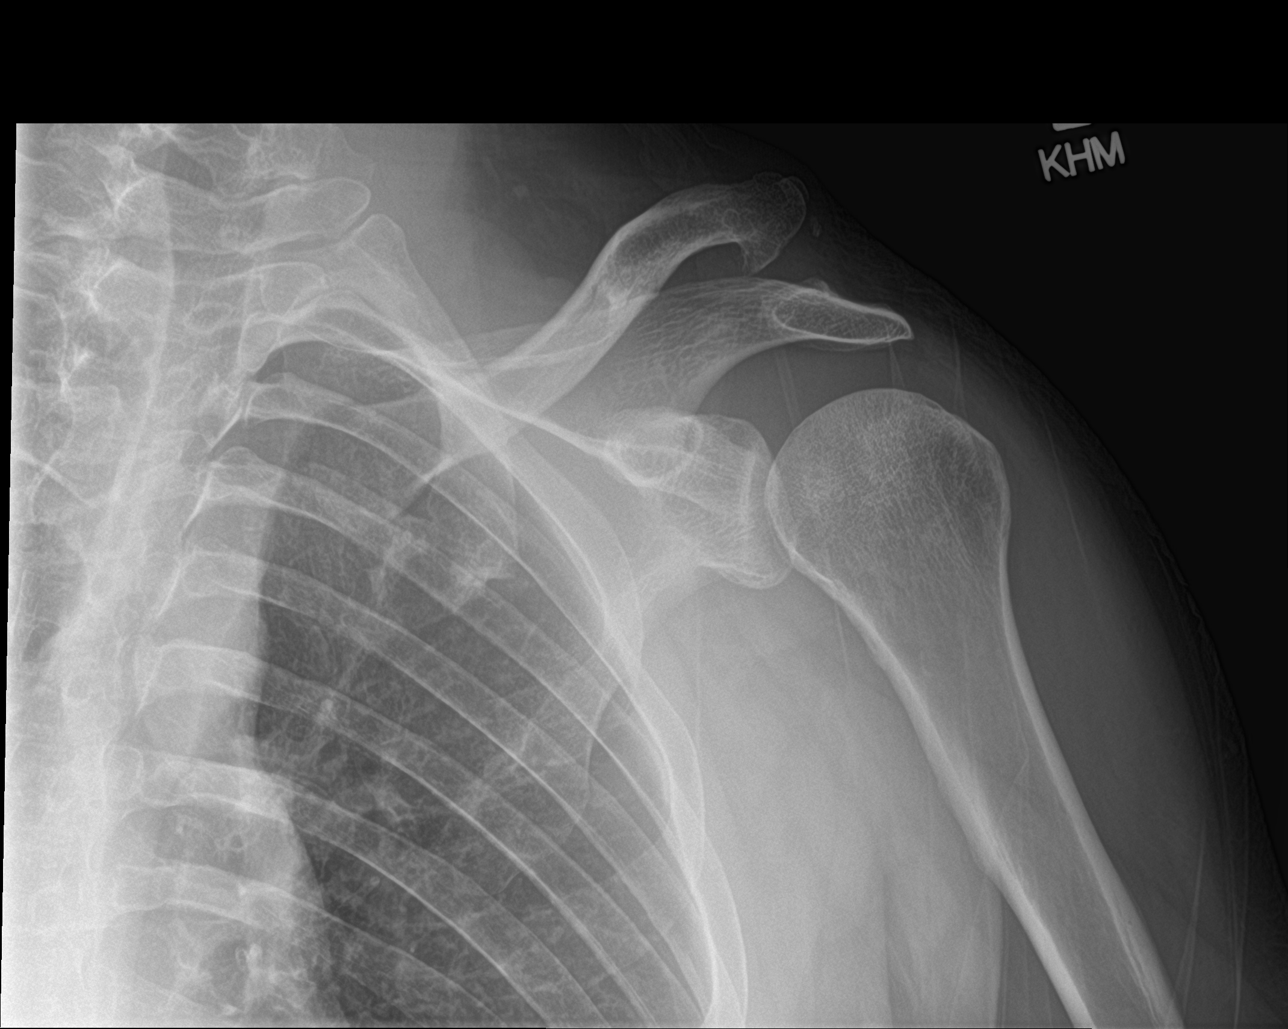

[shoulder y view]
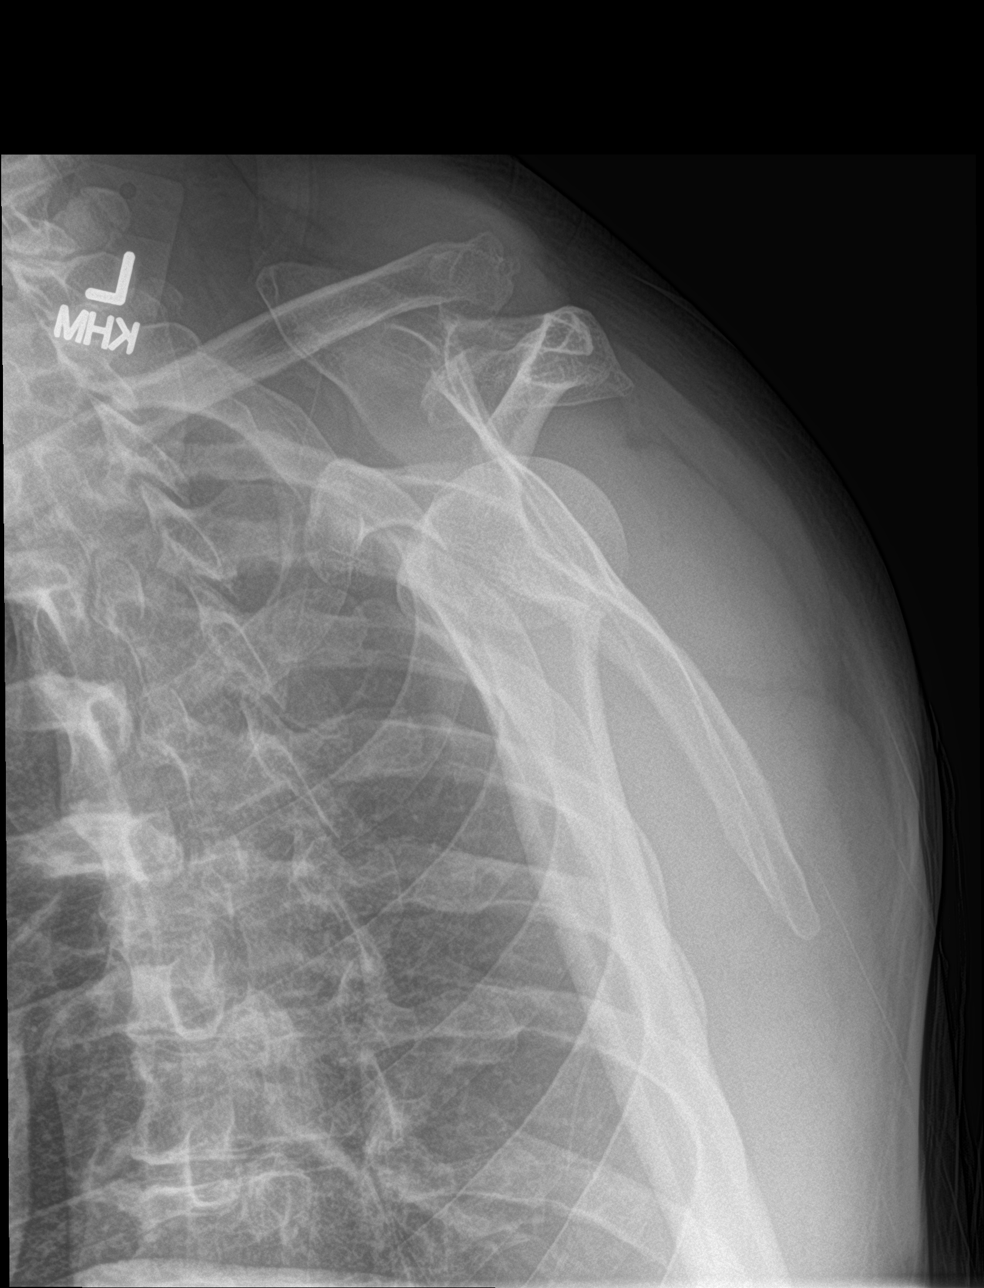

[shoulder axillary]
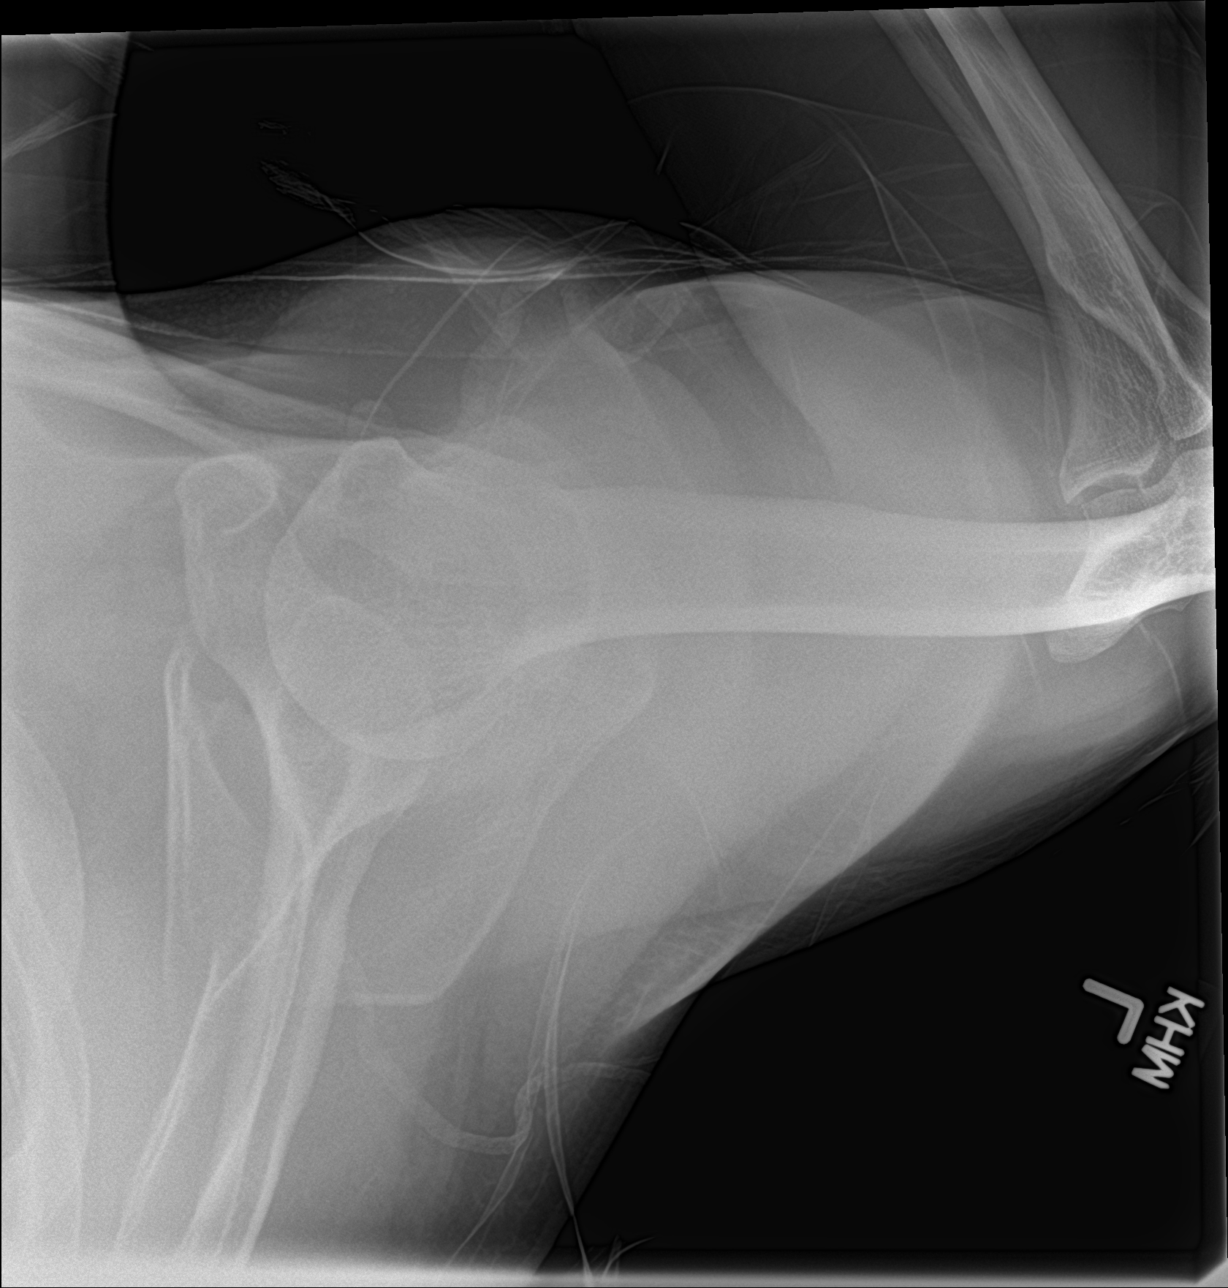

[3 of 3 positions shown; findings below may reference images not displayed]

FINDINGS: There is no evidence of acute fracture or dislocation. Chronic AC
joint separation. Soft tissues are normal.
IMPRESSION: No acute fracture or dislocation identified about the left shoulder.

Chronic AC joint separation.
